# Patient Record
Sex: Male | Born: 1975 | Race: Black or African American | Hispanic: No | Marital: Married | Smoking: Never smoker
Health system: Southern US, Community
[De-identification: ages and names within clinical notes are randomized; demographics above are authoritative.]

## PROBLEM LIST (undated history)

## (undated) DIAGNOSIS — M549 Dorsalgia, unspecified: Secondary | ICD-10-CM

---

## 2021-08-13 ENCOUNTER — Other Ambulatory Visit: Payer: Self-pay

## 2021-08-13 ENCOUNTER — Encounter (HOSPITAL_COMMUNITY): Payer: Self-pay | Admitting: Emergency Medicine

## 2021-08-13 ENCOUNTER — Emergency Department (HOSPITAL_COMMUNITY)
Admission: EM | Admit: 2021-08-13 | Discharge: 2021-08-13 | Disposition: A | Discharge status: Home / Self Care | Payer: 59 | Attending: Emergency Medicine | Admitting: Emergency Medicine

## 2021-08-13 ENCOUNTER — Emergency Department (HOSPITAL_COMMUNITY): Payer: 59

## 2021-08-13 DIAGNOSIS — M549 Dorsalgia, unspecified: Secondary | ICD-10-CM | POA: Diagnosis present

## 2021-08-13 DIAGNOSIS — M5432 Sciatica, left side: Secondary | ICD-10-CM | POA: Diagnosis not present

## 2021-08-13 HISTORY — DX: Dorsalgia, unspecified: M54.9

## 2021-08-13 MED ORDER — ACETAMINOPHEN 500 MG PO TABS
1000.0000 mg | ORAL_TABLET | Freq: Once | ORAL | Status: AC
Start: 2021-08-13 — End: 2021-08-13
  Administered 2021-08-13: 1000 mg via ORAL
  Filled 2021-08-13: qty 2

## 2021-08-13 MED ORDER — METHOCARBAMOL 500 MG PO TABS: 500.0000 mg | ORAL_TABLET | Freq: Two times a day (BID) | ORAL | 0 refills | Status: AC

## 2021-08-13 MED ORDER — KETOROLAC TROMETHAMINE 15 MG/ML IJ SOLN
15.0000 mg | Freq: Once | INTRAMUSCULAR | Status: AC
  Administered 2021-08-13: 15 mg via INTRAMUSCULAR
  Filled 2021-08-13: qty 1

## 2021-08-13 MED ORDER — PREDNISONE 20 MG PO TABS: 40.0000 mg | ORAL_TABLET | Freq: Every day | ORAL | 0 refills | Status: AC

## 2021-08-13 MED ORDER — OXYCODONE HCL 5 MG PO TABS
5.0000 mg | ORAL_TABLET | Freq: Once | ORAL | Status: AC
  Administered 2021-08-13: 5 mg via ORAL
  Filled 2021-08-13: qty 1

## 2021-08-13 MED ORDER — DIAZEPAM 5 MG PO TABS
5.0000 mg | ORAL_TABLET | Freq: Once | ORAL | Status: AC
  Administered 2021-08-13: 5 mg via ORAL
  Filled 2021-08-13: qty 1

## 2021-08-13 NOTE — Discharge Instructions (Addendum)
I have prescribed muscle relaxers for your pain, please do not drink or drive while taking this medications as it can make you drowsy.   I have also prescribed steroids, be aware this medication can cause insomnia, appetite changes.    Please follow-up with PCP in 1 week for reevaluation of your symptoms.If you experience any bowel or bladder incontinence, fever, worsening in your symptoms please return to the ED.  

## 2021-08-13 NOTE — ED Triage Notes (Signed)
C/o L leg pain that starts above L knee and radiates to L hip x 3 days.  Denies injury.  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

## 2021-08-13 NOTE — ED Provider Notes (Signed)
[facility] EMERGENCY DEPARTMENT Provider Note   CSN: [id number] Arrival date & time: 08/13/21  [id number]     History Chief Complaint  Patient presents with   Leg Pain    Craig Huerta is a 46 y.o. male.  46 y.o male with no PMH presents to the ED with a chief complaint of LLE pain x 3 days. 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  Pain is exacerbated with sitting, weightbearing. He describes a numbness, tingling from left buttocks onto the left lower leg.  He has not taken any medication for improvement in symptoms, there is no alleviating factors.  No bowel or bladder incontinence.  No prior history of IV drug use, no prior history of cancer, no fevers.  The history is provided by the patient and medical records.  Leg Pain Associated symptoms: back pain   Associated symptoms: no fever       Home Medications Prior to Admission medications   Medication Sig Start Date End Date Taking? Authorizing Provider  methocarbamol (ROBAXIN) 500 MG tablet Take 1 tablet (500 mg total) by mouth 2 (two) times daily for 7 days. 08/13/21 08/20/21 Yes Viviane Semidey, Beverley Fiedler, PA-C  predniSONE (DELTASONE) 20 MG tablet Take 2 tablets (40 mg total) by mouth daily for 5 days. 08/13/21 08/18/21 Yes Janeece Fitting, PA-C      Allergies    Patient has no allergy information on record.    Review of Systems   Review of Systems  Constitutional:  Negative for fever.  Respiratory:  Negative for shortness of breath.   Gastrointestinal:  Negative for abdominal pain.  Genitourinary:  Negative for difficulty urinating.  Musculoskeletal:  Positive for back pain and myalgias.   Physical Exam Updated Vital Signs BP 124/86    Pulse 81    Temp 98.3 F (36.8 C) (Oral)    Resp 18    Ht 5\' 9"  (1.753 m)    Wt 68 kg    SpO2 98%    BMI 22.15 kg/m  Physical Exam Vitals and nursing note reviewed.  Constitutional:      Appearance: Normal appearance.  HENT:      Head: Normocephalic and atraumatic.     Mouth/Throat:     Mouth: Mucous membranes are moist.  Cardiovascular:     Rate and Rhythm: Normal rate.  Pulmonary:     Effort: Pulmonary effort is normal.  Abdominal:     General: Abdomen is flat.  Musculoskeletal:     Cervical back: Normal range of motion and neck supple.     Comments: RLE- KF,KE 5/5 strength LLE- HF, HE 5/5 strength Normal gait. No pronator drift. No leg drop.  CN I, II and VIII not tested. CN II-XII grossly intact bilaterally.      Skin:    General: Skin is warm and dry.  Neurological:     Mental Status: He is alert and oriented to person, place, and time.    ED Results / Procedures / Treatments   Labs (all labs ordered are listed, but only abnormal results are displayed) Labs Reviewed - No data to display  EKG None  Radiology DG Hip Unilat W or Wo Pelvis 2-3 Views Left  Result Date: 08/13/2021 CLINICAL DATA:  Left lower extremity pain for 3 days. EXAM: DG HIP (WITH OR WITHOUT PELVIS) 2-3V LEFT COMPARISON:  None. FINDINGS: The mild bilateral osteoarthritis noted within the hip joints. No signs of acute fracture or dislocation.  Soft tissues are unremarkable. IMPRESSION: Mild bilateral hip osteoarthritis. Electronically Signed   By: Kerby Moors M.D.   On: 08/13/2021 11:15    Procedures Procedures    Medications Ordered in ED Medications  acetaminophen (TYLENOL) tablet 1,000 mg (1,000 mg Oral Given 08/13/21 1218)  ketorolac (TORADOL) 15 MG/ML injection 15 mg (15 mg Intramuscular Given 08/13/21 1218)  oxyCODONE (Oxy IR/ROXICODONE) immediate release tablet 5 mg (5 mg Oral Given 08/13/21 1219)  diazepam (VALIUM) tablet 5 mg (5 mg Oral Given 08/13/21 1219)    ED Course/ Medical Decision Making/ A&P                           Medical Decision Making Risk OTC drugs. Prescription drug management.    This patient presents to the ED for concern of back pain this involves a number of treatment options, and is a  complaint that carries with it a low risk of complications and morbidity.  The differential diagnosis includes lumbar radiculopathy, sciatica, disc herniation   Co morbidities: Discussed in HPI   Brief History:  Healthy 46 year old male with left leg pain that began 3 days ago, describing numbness and tingling down his left leg.  With no swelling.  No medication tried for improvement in symptoms.  EMR reviewed including pt PMHx, past surgical history and past visits to ER.   See HPI for more details   Imaging Studies:  X-ray was ordered in triage to rule out any lumbar etiology despite no trauma. No acute finding, just bilateral hip to arthritis.   Medicines ordered:  I ordered medication including lateral, Valium, Toradol, oxy to help with pain control along with help with muscle relaxation. Reevaluation of the patient after these medicines showed that the patient improved I have reviewed the patients home medicines and have made adjustments as needed   Reevaluation:  After the interventions noted above I re-evaluated patient and found that they have :improved   Social Determinants of Health:  The patient's social determinants of health were a factor in the care of this patient    Problem List / ED Course:  Left sciatica with lumbar spine pain that began 3 days ago.  No red flags, no trauma, negative x-ray.  Improvement after symptomatic treatment.  Patient with no prior history of diabetes, suspect a short course of muscle relaxers along with steroids will help with pain control at this time.     Dispostion:  After consideration of the diagnostic results and the patients response to treatment, I feel that the patent would benefit from anti-inflammatories, muscle relaxers, steroids to help with pain.  He is agreeable to treatment at this time, will also provide him with a work note.    Portions of this note were generated with [employer]. Dictation  errors may occur despite best attempts at proofreading.  Final Clinical Impression(s) / ED Diagnoses Final diagnoses:  Sciatica of left side    Rx / DC Orders ED Discharge Orders          Ordered    predniSONE (DELTASONE) 20 MG tablet  Daily        08/13/21 1302    methocarbamol (ROBAXIN) 500 MG tablet  2 times daily        08/13/21 1302              Janeece Fitting, PA-C 08/13/21 1303    Sherwood Gambler, MD 08/13/21 1600

## 2021-08-13 NOTE — ED Provider Triage Note (Signed)
Emergency Medicine Provider Triage Evaluation Note  Craig Huerta , a 46 y.o. male  was evaluated in triage.  Pt complains of LLE pain x3 days. Pain worse around left hip/buttocks region. History of chronic low back pain. No injury. 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 Admits to some numbness/tingling. No weakness or bowel/bladder incontinence. No fever. Denies IV drug use  Review of Systems  Positive: arthralgia Negative: fever  Physical Exam  BP 124/86    Pulse 81    Temp 98.3 F (36.8 C) (Oral)    Resp 18    SpO2 98%  Gen:   Awake, no distress   Resp:  Normal effort  MSK:   Moves extremities without difficulty  Other:  TTP throughout left hip and buttocks  Medical Decision Making  Medically screening exam initiated at 10:00 AM.  Appropriate orders placed.  Craig Huerta was informed that the remainder of the evaluation will be completed by another provider, this initial triage assessment does not replace that evaluation, and the importance of remaining in the ED until their evaluation is complete.  X-ray ordered to rule out bony fractures; however suspicion low given no injury.    Craig Stabile, PA-C 08/13/21 1004

## 2023-03-09 IMAGING — CR DG HIP (WITH OR WITHOUT PELVIS) 2-3V*L*
3 series · 3 of 3 positions shown · non-contrast
Comparison: None.

CLINICAL DATA: Left lower extremity pain for 3 days.

EXAM:
DG HIP (WITH OR WITHOUT PELVIS) 2-3V LEFT

[pelvis ap]
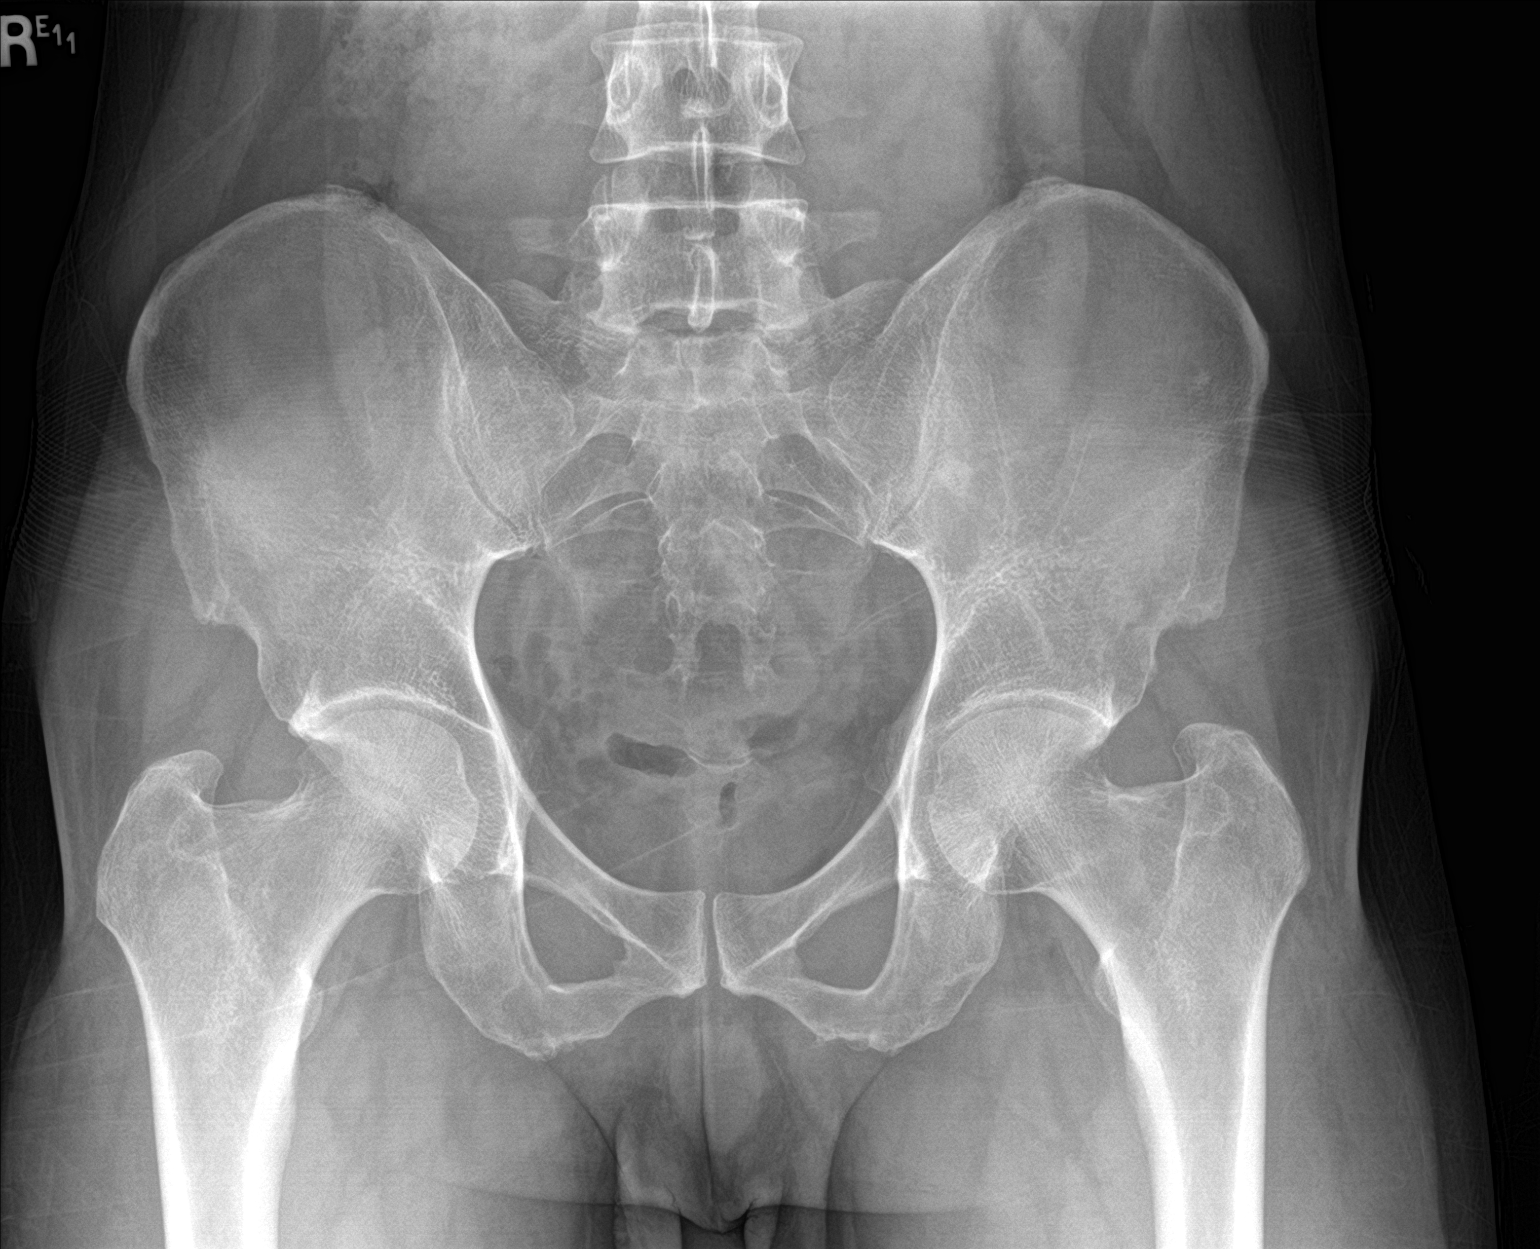

[hip ap]
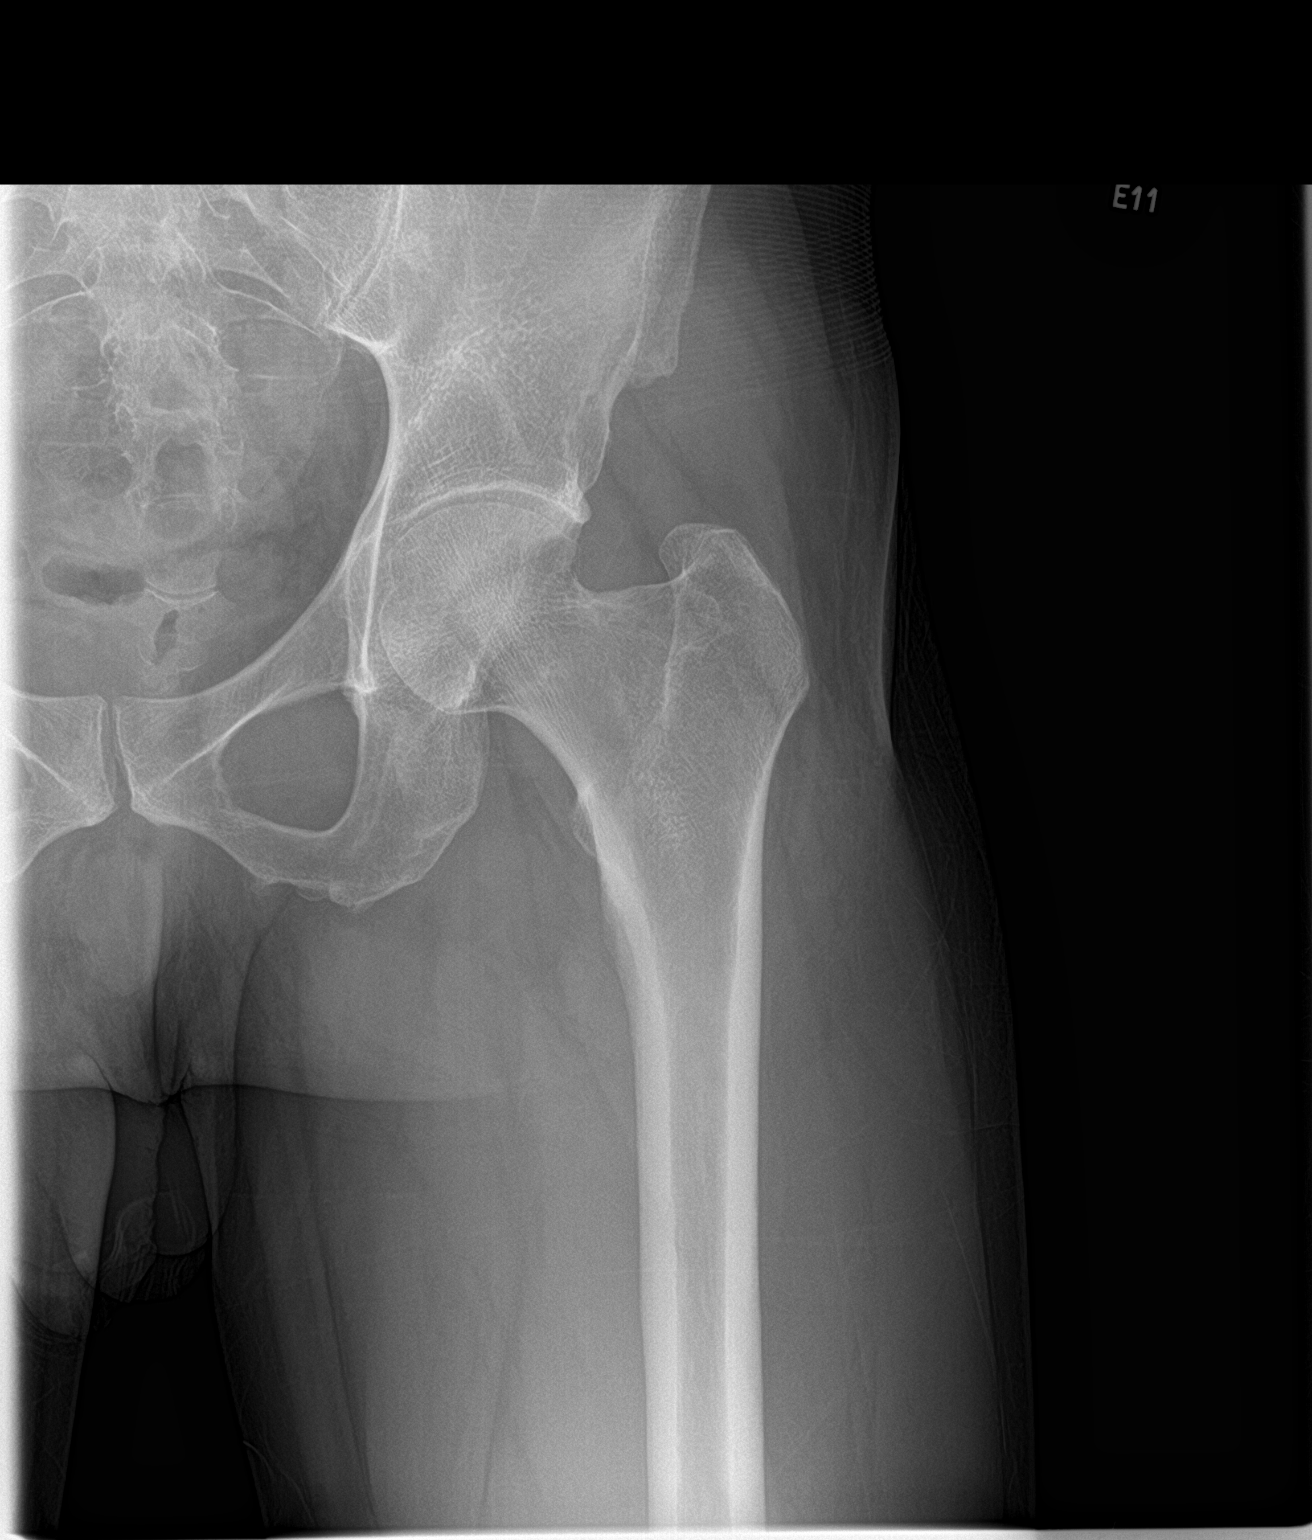

[hip lat]
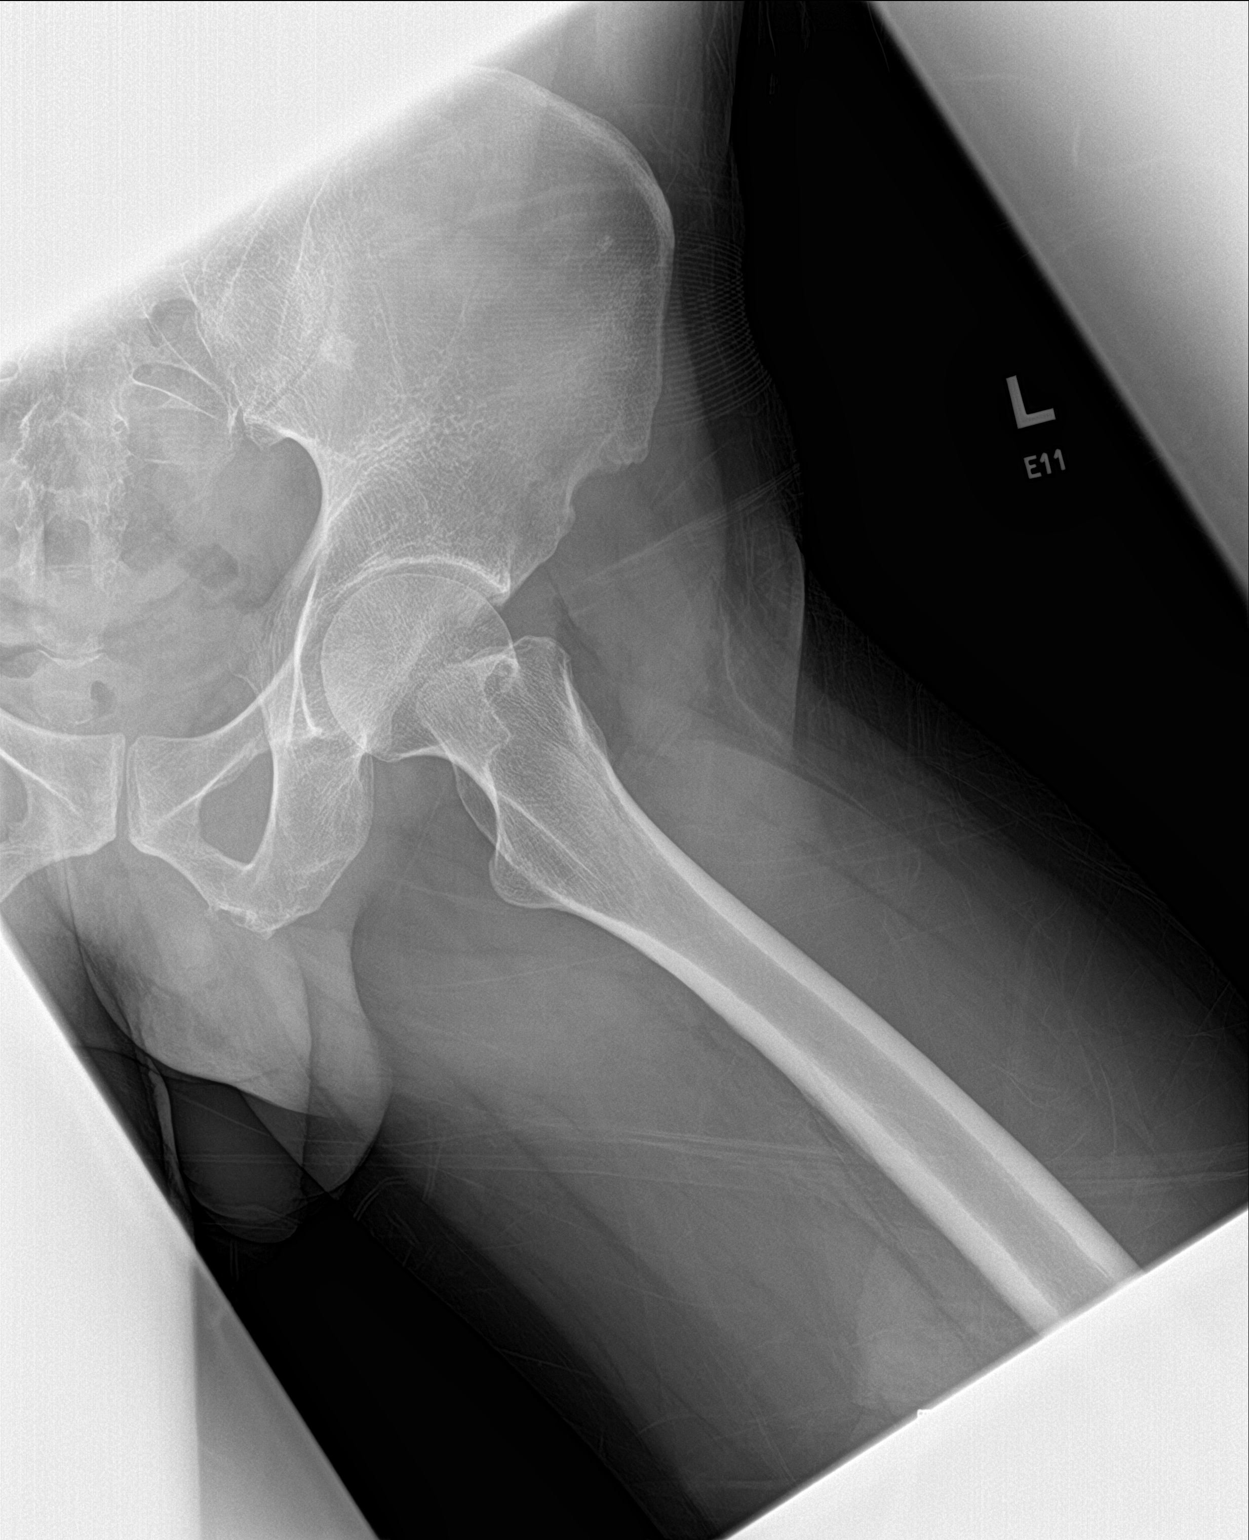

[3 of 3 positions shown; findings below may reference images not displayed]

FINDINGS: The mild bilateral osteoarthritis noted within the hip joints. No
signs of acute fracture or dislocation. Soft tissues are
unremarkable.
IMPRESSION: Mild bilateral hip osteoarthritis.
